# Patient Record
Sex: Female | Born: 2000 | Hispanic: Yes | Marital: Single | Smoking: Never smoker
Health system: Southern US, Community
[De-identification: ages and names within clinical notes are randomized; demographics above are authoritative.]

---
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
## 2021-06-15 ENCOUNTER — Other Ambulatory Visit: Payer: Self-pay

## 2021-06-15 ENCOUNTER — Emergency Department (HOSPITAL_COMMUNITY): Payer: Medicaid Other

## 2021-06-15 ENCOUNTER — Ambulatory Visit (HOSPITAL_COMMUNITY)
Admission: EM | Admit: 2021-06-15 | Discharge: 2021-06-15 | Disposition: A | Discharge status: Home / Self Care | Payer: Medicaid Other | Attending: Emergency Medicine | Admitting: Emergency Medicine

## 2021-06-15 ENCOUNTER — Encounter (HOSPITAL_COMMUNITY)
Admission: EM | Disposition: A | Discharge status: Home / Self Care | Payer: Self-pay | Source: Home / Self Care | Attending: Emergency Medicine

## 2021-06-15 ENCOUNTER — Encounter (HOSPITAL_COMMUNITY): Payer: Self-pay

## 2021-06-15 ENCOUNTER — Ambulatory Visit: Admit: 2021-06-15 | Payer: Medicaid Other | Admitting: General Surgery

## 2021-06-15 DIAGNOSIS — R1013 Epigastric pain: Secondary | ICD-10-CM | POA: Diagnosis present

## 2021-06-15 DIAGNOSIS — Z419 Encounter for procedure for purposes other than remedying health state, unspecified: Secondary | ICD-10-CM

## 2021-06-15 DIAGNOSIS — Z20822 Contact with and (suspected) exposure to covid-19: Secondary | ICD-10-CM | POA: Diagnosis not present

## 2021-06-15 DIAGNOSIS — K8020 Calculus of gallbladder without cholecystitis without obstruction: Secondary | ICD-10-CM

## 2021-06-15 DIAGNOSIS — K8010 Calculus of gallbladder with chronic cholecystitis without obstruction: Secondary | ICD-10-CM | POA: Diagnosis not present

## 2021-06-15 DIAGNOSIS — K810 Acute cholecystitis: Secondary | ICD-10-CM

## 2021-06-15 HISTORY — PX: CHOLECYSTECTOMY: SHX55

## 2021-06-15 LAB — COMPREHENSIVE METABOLIC PANEL
ALT: 133 U/L — ABNORMAL HIGH (ref 0–44)
AST: 125 U/L — ABNORMAL HIGH (ref 15–41)
Albumin: 4.4 g/dL (ref 3.5–5.0)
Alkaline Phosphatase: 98 U/L (ref 38–126)
Anion gap: 8 (ref 5–15)
BUN: 13 mg/dL (ref 6–20)
CO2: 22 mmol/L (ref 22–32)
Calcium: 9.3 mg/dL (ref 8.9–10.3)
Chloride: 106 mmol/L (ref 98–111)
Creatinine, Ser: 0.56 mg/dL (ref 0.44–1.00)
GFR, Estimated: >60 mL/min (ref >60)
Glucose, Bld: 115 mg/dL — ABNORMAL HIGH (ref 70–99)
Potassium: 4.1 mmol/L (ref 3.5–5.1)
Sodium: 136 mmol/L (ref 135–145)
Total Bilirubin: 1.3 mg/dL — ABNORMAL HIGH (ref 0.3–1.2)
Total Protein: 7.6 g/dL (ref 6.5–8.1)

## 2021-06-15 LAB — CBC WITH DIFFERENTIAL/PLATELET
Abs Immature Granulocytes: 0.04 10*3/uL (ref 0.00–0.07)
Basophils Absolute: 0 10*3/uL (ref 0.0–0.1)
Basophils Relative: 0 %
Eosinophils Absolute: 0.1 10*3/uL (ref 0.0–0.5)
Eosinophils Relative: 1 %
HCT: 41.7 % (ref 36.0–46.0)
Hemoglobin: 14.2 g/dL (ref 12.0–15.0)
Immature Granulocytes: 0 %
Lymphocytes Relative: 26 %
Lymphs Abs: 2.7 10*3/uL (ref 0.7–4.0)
MCH: 28.6 pg (ref 26.0–34.0)
MCHC: 34.1 g/dL (ref 30.0–36.0)
MCV: 84.1 fL (ref 80.0–100.0)
Monocytes Absolute: 0.5 10*3/uL (ref 0.1–1.0)
Monocytes Relative: 4 %
Neutro Abs: 7 10*3/uL (ref 1.7–7.7)
Neutrophils Relative %: 69 %
Platelets: 325 10*3/uL (ref 150–400)
RBC: 4.96 MIL/uL (ref 3.87–5.11)
RDW: 12.8 % (ref 11.5–15.5)
WBC: 10.4 10*3/uL (ref 4.0–10.5)
nRBC: 0 % (ref 0.0–0.2)

## 2021-06-15 LAB — URINALYSIS, ROUTINE W REFLEX MICROSCOPIC
Bilirubin Urine: NEGATIVE
Glucose, UA: NEGATIVE mg/dL
Hgb urine dipstick: NEGATIVE
Ketones, ur: NEGATIVE mg/dL
Leukocytes,Ua: NEGATIVE
Nitrite: NEGATIVE
Protein, ur: NEGATIVE mg/dL
Specific Gravity, Urine: 1.002 — ABNORMAL LOW (ref 1.005–1.030)
pH: 6 (ref 5.0–8.0)

## 2021-06-15 LAB — HCG, QUANTITATIVE, PREGNANCY: hCG, Beta Chain, Quant, S: <1 m[IU]/mL (ref <5)

## 2021-06-15 LAB — RESP PANEL BY RT-PCR (FLU A&B, COVID) ARPGX2
Influenza A by PCR: NEGATIVE
Influenza B by PCR: NEGATIVE
SARS Coronavirus 2 by RT PCR: NEGATIVE

## 2021-06-15 LAB — LIPASE, BLOOD: Lipase: 30 U/L (ref 11–51)

## 2021-06-15 SURGERY — LAPAROSCOPIC CHOLECYSTECTOMY
Anesthesia: General | Site: Abdomen

## 2021-06-15 MED ORDER — PROMETHAZINE HCL 25 MG/ML IJ SOLN
INTRAMUSCULAR | Status: AC
  Filled 2021-06-15: qty 1

## 2021-06-15 MED ORDER — PROPOFOL 10 MG/ML IV BOLUS
INTRAVENOUS | Status: DC | PRN
  Administered 2021-06-15: 140 mg via INTRAVENOUS

## 2021-06-15 MED ORDER — ACETAMINOPHEN 500 MG PO TABS
1000.0000 mg | ORAL_TABLET | ORAL | Status: AC
  Administered 2021-06-15: 1000 mg via ORAL
  Filled 2021-06-15: qty 2

## 2021-06-15 MED ORDER — SUGAMMADEX SODIUM 200 MG/2ML IV SOLN
INTRAVENOUS | Status: DC | PRN
  Administered 2021-06-15: 200 mg via INTRAVENOUS

## 2021-06-15 MED ORDER — PROPOFOL 10 MG/ML IV BOLUS
INTRAVENOUS | Status: AC
  Filled 2021-06-15: qty 20

## 2021-06-15 MED ORDER — MIDAZOLAM HCL 2 MG/2ML IJ SOLN
INTRAMUSCULAR | Status: DC | PRN
  Administered 2021-06-15: 2 mg via INTRAVENOUS

## 2021-06-15 MED ORDER — PROMETHAZINE HCL 25 MG/ML IJ SOLN
6.2500 mg | INTRAMUSCULAR | Status: DC | PRN
  Administered 2021-06-15: 6.25 mg via INTRAVENOUS

## 2021-06-15 MED ORDER — LIDOCAINE HCL (PF) 2 % IJ SOLN
INTRAMUSCULAR | Status: AC
  Filled 2021-06-15: qty 5

## 2021-06-15 MED ORDER — SUCCINYLCHOLINE CHLORIDE 200 MG/10ML IV SOSY
PREFILLED_SYRINGE | INTRAVENOUS | Status: DC | PRN
  Administered 2021-06-15: 100 mg via INTRAVENOUS

## 2021-06-15 MED ORDER — ACETAMINOPHEN 500 MG PO TABS: 1000.0000 mg | ORAL_TABLET | Freq: Three times a day (TID) | ORAL | Status: AC | PRN

## 2021-06-15 MED ORDER — ONDANSETRON HCL 4 MG/2ML IJ SOLN
INTRAMUSCULAR | Status: DC | PRN
  Administered 2021-06-15: 4 mg via INTRAVENOUS

## 2021-06-15 MED ORDER — FENTANYL CITRATE PF 50 MCG/ML IJ SOSY
PREFILLED_SYRINGE | INTRAMUSCULAR | Status: AC
  Filled 2021-06-15: qty 2

## 2021-06-15 MED ORDER — MORPHINE SULFATE (PF) 4 MG/ML IV SOLN
4.0000 mg | Freq: Once | INTRAVENOUS | Status: AC
  Administered 2021-06-15: 4 mg via INTRAVENOUS
  Filled 2021-06-15: qty 1

## 2021-06-15 MED ORDER — BUPIVACAINE-EPINEPHRINE 0.25% -1:200000 IJ SOLN
INTRAMUSCULAR | Status: DC | PRN
  Administered 2021-06-15: 14 mL

## 2021-06-15 MED ORDER — MIDAZOLAM HCL 2 MG/2ML IJ SOLN
INTRAMUSCULAR | Status: AC
  Filled 2021-06-15: qty 2

## 2021-06-15 MED ORDER — GABAPENTIN 300 MG PO CAPS
300.0000 mg | ORAL_CAPSULE | ORAL | Status: AC
  Administered 2021-06-15: 300 mg via ORAL
  Filled 2021-06-15: qty 1

## 2021-06-15 MED ORDER — SPY AGENT GREEN - (INDOCYANINE FOR INJECTION)
INTRAMUSCULAR | Status: DC | PRN
  Administered 2021-06-15: 2 mL via INTRAVENOUS

## 2021-06-15 MED ORDER — INDOCYANINE GREEN 25 MG IV SOLR: 1.2500 mg | Freq: Once | INTRAVENOUS | Status: DC

## 2021-06-15 MED ORDER — LIDOCAINE 2% (20 MG/ML) 5 ML SYRINGE
INTRAMUSCULAR | Status: DC | PRN
  Administered 2021-06-15: 50 mg via INTRAVENOUS

## 2021-06-15 MED ORDER — LACTATED RINGERS IV SOLN
INTRAVENOUS | Status: DC | PRN
  Administered 2021-06-15: 1000 mL

## 2021-06-15 MED ORDER — FENTANYL CITRATE (PF) 100 MCG/2ML IJ SOLN
INTRAMUSCULAR | Status: AC
  Filled 2021-06-15: qty 2

## 2021-06-15 MED ORDER — OXYCODONE HCL 5 MG PO TABS: 5.0000 mg | ORAL_TABLET | Freq: Four times a day (QID) | ORAL | 0 refills | Status: DC | PRN

## 2021-06-15 MED ORDER — FENTANYL CITRATE (PF) 100 MCG/2ML IJ SOLN
INTRAMUSCULAR | Status: DC | PRN
  Administered 2021-06-15: 100 ug via INTRAVENOUS
  Administered 2021-06-15: 50 ug via INTRAVENOUS

## 2021-06-15 MED ORDER — FENTANYL CITRATE PF 50 MCG/ML IJ SOSY
25.0000 ug | PREFILLED_SYRINGE | INTRAMUSCULAR | Status: DC | PRN
  Administered 2021-06-15 (×2): 50 ug via INTRAVENOUS
  Administered 2021-06-15 (×2): 25 ug via INTRAVENOUS

## 2021-06-15 MED ORDER — DEXAMETHASONE SODIUM PHOSPHATE 10 MG/ML IJ SOLN
INTRAMUSCULAR | Status: DC | PRN
  Administered 2021-06-15: 4 mg via INTRAVENOUS

## 2021-06-15 MED ORDER — DEXAMETHASONE SODIUM PHOSPHATE 10 MG/ML IJ SOLN
INTRAMUSCULAR | Status: AC
  Filled 2021-06-15: qty 1

## 2021-06-15 MED ORDER — ONDANSETRON HCL 4 MG/2ML IJ SOLN
4.0000 mg | Freq: Once | INTRAMUSCULAR | Status: AC
  Administered 2021-06-15: 4 mg via INTRAVENOUS
  Filled 2021-06-15: qty 2

## 2021-06-15 MED ORDER — ONDANSETRON HCL 4 MG/2ML IJ SOLN
INTRAMUSCULAR | Status: AC
  Filled 2021-06-15: qty 2

## 2021-06-15 MED ORDER — INDOCYANINE GREEN 25 MG IV SOLR
1.2500 mg | INTRAVENOUS | Status: DC
  Filled 2021-06-15: qty 10

## 2021-06-15 MED ORDER — LACTATED RINGERS IV SOLN: INTRAVENOUS | Status: DC

## 2021-06-15 MED ORDER — ROCURONIUM BROMIDE 10 MG/ML (PF) SYRINGE
PREFILLED_SYRINGE | INTRAVENOUS | Status: DC | PRN
  Administered 2021-06-15: 30 mg via INTRAVENOUS

## 2021-06-15 MED ORDER — OXYCODONE HCL 5 MG/5ML PO SOLN: 5.0000 mg | Freq: Once | ORAL | Status: AC | PRN

## 2021-06-15 MED ORDER — OXYCODONE HCL 5 MG PO TABS
ORAL_TABLET | ORAL | Status: AC
  Administered 2021-06-15: 5 mg via ORAL
  Filled 2021-06-15: qty 1

## 2021-06-15 MED ORDER — BUPIVACAINE-EPINEPHRINE (PF) 0.25% -1:200000 IJ SOLN
INTRAMUSCULAR | Status: AC
  Filled 2021-06-15: qty 30

## 2021-06-15 MED ORDER — CEFAZOLIN SODIUM-DEXTROSE 2-4 GM/100ML-% IV SOLN
2.0000 g | INTRAVENOUS | Status: AC
Start: 2021-06-15 — End: 2021-06-15
  Administered 2021-06-15: 2 g via INTRAVENOUS
  Filled 2021-06-15: qty 100

## 2021-06-15 MED ORDER — OXYCODONE HCL 5 MG PO TABS: 5.0000 mg | ORAL_TABLET | Freq: Once | ORAL | Status: AC | PRN

## 2021-06-15 MED ORDER — SODIUM CHLORIDE 0.9 % IV BOLUS
1000.0000 mL | Freq: Once | INTRAVENOUS | Status: AC
  Administered 2021-06-15: 1000 mL via INTRAVENOUS

## 2021-06-15 MED ORDER — FENTANYL CITRATE PF 50 MCG/ML IJ SOSY
PREFILLED_SYRINGE | INTRAMUSCULAR | Status: AC
  Filled 2021-06-15: qty 1

## 2021-06-15 MED ORDER — 0.9 % SODIUM CHLORIDE (POUR BTL) OPTIME
TOPICAL | Status: DC | PRN
  Administered 2021-06-15: 1000 mL

## 2021-06-15 MED ORDER — KETOROLAC TROMETHAMINE 15 MG/ML IJ SOLN: 15.0000 mg | INTRAMUSCULAR | Status: DC

## 2021-06-15 SURGICAL SUPPLY — 43 items
ADH SKN CLS APL DERMABOND .7 (GAUZE/BANDAGES/DRESSINGS) ×2
APL PRP STRL LF DISP 70% ISPRP (MISCELLANEOUS) ×2
APL SKNCLS STERI-STRIP NONHPOA (GAUZE/BANDAGES/DRESSINGS) ×2
APPLIER CLIP 5 13 M/L LIGAMAX5 (MISCELLANEOUS) ×3
APR CLP MED LRG 5 ANG JAW (MISCELLANEOUS) ×2
BAG COUNTER SPONGE SURGICOUNT (BAG) IMPLANT
BAG SPEC RTRVL 10 TROC 200 (ENDOMECHANICALS) ×2
BAG SPNG CNTER NS LX DISP (BAG)
BENZOIN TINCTURE PRP APPL 2/3 (GAUZE/BANDAGES/DRESSINGS) ×2 IMPLANT
CABLE HIGH FREQUENCY MONO STRZ (ELECTRODE) ×3 IMPLANT
CHLORAPREP W/TINT 26 (MISCELLANEOUS) ×3 IMPLANT
CLIP APPLIE 5 13 M/L LIGAMAX5 (MISCELLANEOUS) ×2 IMPLANT
COVER MAYO STAND STRL (DRAPES) IMPLANT
COVER SURGICAL LIGHT HANDLE (MISCELLANEOUS) ×3 IMPLANT
DERMABOND ADVANCED (GAUZE/BANDAGES/DRESSINGS) ×1
DERMABOND ADVANCED .7 DNX12 (GAUZE/BANDAGES/DRESSINGS) ×1 IMPLANT
DEVICE TROCAR PUNCTURE CLOSURE (ENDOMECHANICALS) IMPLANT
DRAPE C-ARM 42X120 X-RAY (DRAPES) IMPLANT
ELECT REM PT RETURN 15FT ADLT (MISCELLANEOUS) ×3 IMPLANT
GLOVE SURG ENC MOIS LTX SZ7 (GLOVE) ×3 IMPLANT
GLOVE SURG UNDER POLY LF SZ7.5 (GLOVE) ×3 IMPLANT
GOWN STRL REUS W/TWL LRG LVL3 (GOWN DISPOSABLE) ×5 IMPLANT
GOWN STRL REUS W/TWL XL LVL3 (GOWN DISPOSABLE) ×6 IMPLANT
GRASPER SUT TROCAR 14GX15 (MISCELLANEOUS) ×2 IMPLANT
HEMOSTAT SNOW SURGICEL 2X4 (HEMOSTASIS) IMPLANT
IRRIG SUCT STRYKERFLOW 2 WTIP (MISCELLANEOUS) ×3
IRRIGATION SUCT STRKRFLW 2 WTP (MISCELLANEOUS) ×2 IMPLANT
KIT BASIN OR (CUSTOM PROCEDURE TRAY) ×3 IMPLANT
POUCH RETRIEVAL ECOSAC 10 (ENDOMECHANICALS) ×2 IMPLANT
POUCH RETRIEVAL ECOSAC 10MM (ENDOMECHANICALS) ×3
SCISSORS LAP 5X35 DISP (ENDOMECHANICALS) ×3 IMPLANT
SET CHOLANGIOGRAPH MIX (MISCELLANEOUS) IMPLANT
SET TUBE SMOKE EVAC HIGH FLOW (TUBING) ×3 IMPLANT
SLEEVE XCEL OPT CAN 5 100 (ENDOMECHANICALS) ×6 IMPLANT
STRIP CLOSURE SKIN 1/2X4 (GAUZE/BANDAGES/DRESSINGS) ×2 IMPLANT
SUT MNCRL AB 4-0 PS2 18 (SUTURE) ×3 IMPLANT
SUT VICRYL 0 UR6 27IN ABS (SUTURE) ×2 IMPLANT
TOWEL OR 17X26 10 PK STRL BLUE (TOWEL DISPOSABLE) ×3 IMPLANT
TOWEL OR NON WOVEN STRL DISP B (DISPOSABLE) ×3 IMPLANT
TRAY LAPAROSCOPIC (CUSTOM PROCEDURE TRAY) ×3 IMPLANT
TROCAR BLADELESS OPT 5 100 (ENDOMECHANICALS) ×3 IMPLANT
TROCAR XCEL BLUNT TIP 100MML (ENDOMECHANICALS) ×3 IMPLANT
TROCAR XCEL NON-BLD 11X100MML (ENDOMECHANICALS) IMPLANT

## 2021-06-15 NOTE — Anesthesia Preprocedure Evaluation (Addendum)
Anesthesia Evaluation  Patient identified by MRN, date of birth, ID band Patient awake    Reviewed: Allergy & Precautions, NPO status , Patient's Chart, lab work & pertinent test results  Airway Mallampati: II  TM Distance: >3 FB Neck ROM: Full    Dental  (+) Teeth Intact   Pulmonary neg pulmonary ROS,    Pulmonary exam normal        Cardiovascular negative cardio ROS   Rhythm:Regular Rate:Normal     Neuro/Psych  Headaches, negative psych ROS   GI/Hepatic Neg liver ROS, Acute cholecystitis    Endo/Other  negative endocrine ROS  Renal/GU negative Renal ROS  negative genitourinary   Musculoskeletal negative musculoskeletal ROS (+)   Abdominal (+)  Abdomen: soft.    Peds  Hematology negative hematology ROS (+)   Anesthesia Other Findings   Reproductive/Obstetrics                            Anesthesia Physical Anesthesia Plan  ASA: 1  Anesthesia Plan: General   Post-op Pain Management:    Induction: Intravenous  PONV Risk Score and Plan: 3 and Ondansetron, Dexamethasone, Midazolam and Treatment may vary due to age or medical condition  Airway Management Planned: Mask and Oral ETT  Additional Equipment: None  Intra-op Plan:   Post-operative Plan: Extubation in OR  Informed Consent: I have reviewed the patients History and Physical, chart, labs and discussed the procedure including the risks, benefits and alternatives for the proposed anesthesia with the patient or authorized representative who has indicated his/her understanding and acceptance.     Dental advisory given  Plan Discussed with: CRNA  Anesthesia Plan Comments: (Lab Results      Component                Value               Date                      WBC                      10.4                06/15/2021                HGB                      14.2                06/15/2021                HCT                       41.7                06/15/2021                MCV                      84.1                06/15/2021                PLT                      325  06/15/2021           Lab Results      Component                Value               Date                      NA                       136                 06/15/2021                K                        4.1                 06/15/2021                CO2                      22                  06/15/2021                GLUCOSE                  115 (H)             06/15/2021                BUN                      13                  06/15/2021                CREATININE               0.56                06/15/2021                CALCIUM                  9.3                 06/15/2021                GFRNONAA                 >60                 06/15/2021          )       Anesthesia Quick Evaluation

## 2021-06-15 NOTE — ED Triage Notes (Signed)
Patient arrives from home with complaint of back and abdominal pain. Pt reports pain ongoing since lastnight and states that upon waking lastnight from pain she threw up. Patient reports problem has been reoccurring since march every 2-3 months.

## 2021-06-15 NOTE — Discharge Instructions (Signed)
[facility]  Please arrive at least 30 min before your appointment to complete your check in paperwork.  If you are unable to arrive 30 min prior to your appointment time we may have to cancel or reschedule you. LAPAROSCOPIC SURGERY: POST OP INSTRUCTIONS Always review your discharge instruction sheet given to you by the facility where your surgery was performed. IF YOU HAVE DISABILITY OR FAMILY LEAVE FORMS, YOU MUST BRING THEM TO THE OFFICE FOR PROCESSING.   DO NOT GIVE THEM TO YOUR DOCTOR.  PAIN CONTROL  First take acetaminophen (Tylenol) AND/or ibuprofen (Advil) to control your pain after surgery.  Follow directions on package.  Taking acetaminophen (Tylenol) and/or ibuprofen (Advil) regularly after surgery will help to control your pain and lower the amount of prescription pain medication you may need.  You should not take more than 4,000 mg (4 grams) of acetaminophen (Tylenol) in 24 hours.  You should not take ibuprofen (Advil), aleve, motrin, naprosyn or other NSAIDS if you have a history of stomach ulcers or chronic kidney disease.  A prescription for pain medication may be given to you upon discharge.  Take your pain medication as prescribed, if you still have uncontrolled pain after taking acetaminophen (Tylenol) or ibuprofen (Advil). Use ice packs to help control pain. If you need a refill on your pain medication, please contact your pharmacy.  They will contact our office to request authorization. Prescriptions will not be filled after 5pm or on week-ends.  HOME MEDICATIONS Take your usually prescribed medications unless otherwise directed.  DIET You should follow a light diet the first few days after arrival home.  Be sure to include lots of fluids daily. Avoid fatty, fried foods.   CONSTIPATION It is common to experience some constipation after surgery and if you are taking pain medication.  Increasing fluid intake and taking a stool softener (such as Colace)  will usually help or prevent this problem from occurring.  A mild laxative (Milk of Magnesia or Miralax) should be taken according to package instructions if there are no bowel movements after 48 hours.  WOUND/INCISION CARE Most patients will experience some swelling and bruising in the area of the incisions.  Ice packs will help.  Swelling and bruising can take several days to resolve.  Unless discharge instructions indicate otherwise, follow guidelines below  STERI-STRIPS - you may remove your outer bandages 48 hours after surgery, and you may shower at that time.  You have steri-strips (small skin tapes) in place directly over the incision.  These strips should be left on the skin for 7-10 days.   DERMABOND/SKIN GLUE - you may shower in 24 hours.  The glue will flake off over the next 2-3 weeks. Any sutures or staples will be removed at the office during your follow-up visit.  ACTIVITIES You may resume regular (light) daily activities beginning the next day--such as daily self-care, walking, climbing stairs--gradually increasing activities as tolerated.  You may have sexual intercourse when it is comfortable.  Refrain from any heavy lifting or straining until approved by your doctor. You may drive when you are no longer taking prescription pain medication, you can comfortably wear a seatbelt, and you can safely maneuver your car and apply brakes.  FOLLOW-UP You should see your doctor in the office for a follow-up appointment approximately 2-3 weeks after your surgery.  You should have been given your post-op/follow-up appointment when your surgery was scheduled.  If you did not receive a post-op/follow-up appointment, make sure   that you call for this appointment within a day or two after you arrive home to insure a convenient appointment time.  OTHER INSTRUCTIONS  WHEN TO CALL YOUR DOCTOR: Fever over 101.0 Inability to urinate Continued bleeding from incision. Increased pain, redness, or  drainage from the incision. Increasing abdominal pain  The clinic staff is available to answer your questions during regular business hours.  Please don't hesitate to call and ask to speak to one of the nurses for clinical concerns.  If you have a medical emergency, go to the nearest emergency room or call 911.  A surgeon from [facility] is always on call at the hospital. [address], [address], [place], [place]  [address] ? [address] [address], [place], [place]   [address] [phone] ? [phone] ? [phone]   

## 2021-06-15 NOTE — Transfer of Care (Signed)
Immediate Anesthesia Transfer of Care Note  Patient: Tricia Pratt  Procedure(s) Performed: LAPAROSCOPIC CHOLECYSTECTOMY WITH ICG DYE (Abdomen)  Patient Location: PACU  Anesthesia Type:General  Level of Consciousness: awake, alert  and oriented  Airway & Oxygen Therapy: Patient Spontanous Breathing and Patient connected to face mask oxygen  Post-op Assessment: Report given to RN, Post -op Vital signs reviewed and stable and Patient moving all extremities X 4  Post vital signs: Reviewed and stable  Last Vitals:  Vitals Value Taken Time  BP 128/58 06/15/21 1409  Temp    Pulse 92 06/15/21 1410  Resp 20 06/15/21 1410  SpO2 100 % 06/15/21 1410  Vitals shown include unvalidated device data.  Last Pain:  Vitals:   06/15/21 1223  TempSrc:   PainSc: 2          Complications: No notable events documented.

## 2021-06-15 NOTE — Anesthesia Procedure Notes (Signed)
Procedure Name: Intubation Date/Time: 06/15/2021 12:53 PM Performed by: Niel Hummer, CRNA Pre-anesthesia Checklist: Patient identified, Emergency Drugs available, Suction available and Patient being monitored Patient Re-evaluated:Patient Re-evaluated prior to induction Oxygen Delivery Method: Circle system utilized Preoxygenation: Pre-oxygenation with 100% oxygen Induction Type: IV induction and Rapid sequence Laryngoscope Size: Mac and 4 Grade View: Grade I Tube type: Oral Tube size: 7.0 mm Number of attempts: 1 Airway Equipment and Method: Stylet Placement Confirmation: ETT inserted through vocal cords under direct vision, positive ETCO2 and breath sounds checked- equal and bilateral Secured at: 21 cm Tube secured with: Tape Dental Injury: Teeth and Oropharynx as per pre-operative assessment

## 2021-06-15 NOTE — Op Note (Signed)
Preoperative diagnosis: cholecystitis Postoperative diagnosis: acute cholecystitis Procedure: Laparoscopic cholecystectomy Surgeon: Dr. Harden Mo Asst: Leary Roca, PA-C Anesthesia: General Estimated blood loss: Minimal Complications: None Drains: None Specimens: Gallbladder and contents to pathology Sponge needle count was correct at completion Disposition to recovery stable condition  Indications: 20 yof with ruq pain, gallstones on [place] who I discussed laparoscopic cholecystectomy with. we discussed possible cholangiogram due to mildly elevated lfts.    Procedure: After informed consent was obtained the patient was taken to the OR.  She was given antibiotics.  SCDs were in place.  She was then placed under general anesthesia without complication.  She was prepped and draped in the standard sterile surgical fashion.  A surgical timeout was then performed.  I infiltrated Marcaine below the umbilicus.  I made a vertical incision.  I grasped the fascia and incised it sharply.  I entered the peritoneum bluntly.  There was no evidence of an entry injury.  I then placed a 0 Vicryl pursestring suture through the fascia.  I then placed a Hassan trocar and insufflated the abdomen to 15 mmHg pressure.  I then placed 3 additional 5 mm trocars in epigastrium and right side of the abdomen under direct vision without complication.  Her gallbladder was noted to have acute cholecystitis.   I retracted the gallbladder cephalad and lateral.  this was difficult as there was a large stone impacted at the neck. I did have her injected with ICG dye and used this to help identify the critical view of safety.  I was able to obtain the critical view of safety.  I then clipped the cystic duct.   I divided it leaving 3 clips in place.  The cystic duct was viable and the clips completely traversed the duct.  I treated the artery in a similar fashion. The gallbladder is then removed from the liver bed.  I then placed  the gallbladder in retrieval bag. I removed all of this from the umbilical trocar site.  Hemostasis was obtained.  I then removed the [facility] trocar.  I tied my pursestring down.  I placed an additional 0 Vicryl suture using the suture passer device to completely obliterate this defect.  I then desufflated the abdomen and remove the remaining trocars.  These were closed with 4-0 Monocryl and glue.  She tolerated this well was extubated transferred to recovery stable.

## 2021-06-15 NOTE — H&P (Signed)
[facility] Admission Note  Tricia Pratt August 03, 2001  [id number].    Requesting MD: Dr. Eulis Foster, [facility] PA-C Chief Complaint/Reason for Consult: Acute Cholecystitis   HPI:  Tricia Pratt is a 20 y.o. female who presented to the [facility] ED on 10/19 for abdominal pain, n/v. Patient reports that yesterday she began having epigastric and ruq abdominal pain with radiation to her back that was constant, moderate in severity (6-7/10) and associated with nausea. She reports 1 episode of emesis. She tried Alka-seltzer without help. She ate a peach last night and had some abdominal discomfort. She reports history of similar symptoms in the past since February of this year that self resolved. She presented to the ED as this episode was more severe and persisted into this morning.   She underwent workup in the ED and was found to have normal wbc at 10.4, normal lipase, elevated LFT's (AST 125, ALT 133, Alk Phos 98, T. Bili 1.3), and RUQ [place] w/ Cholelithiasis w/ 1.2cm gallstone in GB neck. No GB wall thickening or pericholecystitis fluid. CBD 3.6. General surgery asked to see.  NPO: Yes PMH significant for None Abdominal surgical history: None Anticoagulants: None Tbcc: Never Alc: None Illicit drug use: None Employment: Ship broker at [employer]  ROS: Review of Systems  Constitutional:  Negative for chills and fever.  Respiratory:  Negative for cough.   Cardiovascular:  Negative for chest pain.  Gastrointestinal:  Positive for abdominal pain, nausea and vomiting. Negative for diarrhea.  Genitourinary: Negative.  Negative for dysuria.  Musculoskeletal:  Positive for back pain.  Psychiatric/Behavioral:  Negative for substance abuse.   All other systems reviewed and are negative. All systems reviewed and otherwise negative except for as above  History reviewed. No pertinent family history.  History reviewed. No pertinent past medical history.  History reviewed. No  pertinent surgical history.  Social History:  reports that she has never smoked. She has never used smokeless tobacco. She reports that she does not drink alcohol and does not use drugs.  Allergies: No Known Allergies  (Not in a hospital admission)   Prior to Admission medications   Not on File    Blood pressure 102/64, pulse (!) 58, temperature 98.5 F (36.9 C), temperature source Oral, resp. rate 16, height $RemoveBe'5\' 1"'uTdiAkeUB$  (1.549 m), weight 81.3 kg, SpO2 99 %. Physical Exam: General: pleasant, WD/WN female who is laying in bed in NAD HEENT: head is normocephalic, atraumatic.  Sclera are noninjected.  Pupils equal and round.  Ears and nose without any masses or lesions.  Mouth is pink and moist. Dentition fair Heart: Bradycardic with regular rhythm.  Normal s1,s2. No obvious murmurs, gallops, or rubs noted.  Palpable pedal pulses bilaterally  Lungs: CTAB, no wheezes, rhonchi, or rales noted.  Respiratory effort nonlabored Abd: Soft, ND, minimal epigastric and RUQ tenderness without peritonitis, +BS, no masses, hernias, or organomegaly. No prior surgical scars.  MS: no BUE/BLE edema, calves soft and nontender Skin: warm and dry with no masses, lesions, or rashes Psych: A&Ox4 with an appropriate affect Neuro: cranial nerves grossly intact, equal strength in BUE/BLE bilaterally, normal speech, thought process intact  Results for orders placed or performed during the hospital encounter of 06/15/21 (from the past 48 hour(s))  CBC with Differential     Status: None   Collection Time: 06/15/21  5:52 AM  Result Value Ref Range   WBC 10.4 4.0 - 10.5 K/uL   RBC 4.96 3.87 - 5.11 MIL/uL   Hemoglobin 14.2 12.0 -  15.0 g/dL   HCT 41.7 36.0 - 46.0 %   MCV 84.1 80.0 - 100.0 fL   MCH 28.6 26.0 - 34.0 pg   MCHC 34.1 30.0 - 36.0 g/dL   RDW 12.8 11.5 - 15.5 %   Platelets 325 150 - 400 K/uL   nRBC 0.0 0.0 - 0.2 %   Neutrophils Relative % 69 %   Neutro Abs 7.0 1.7 - 7.7 K/uL   Lymphocytes Relative 26 %    Lymphs Abs 2.7 0.7 - 4.0 K/uL   Monocytes Relative 4 %   Monocytes Absolute 0.5 0.1 - 1.0 K/uL   Eosinophils Relative 1 %   Eosinophils Absolute 0.1 0.0 - 0.5 K/uL   Basophils Relative 0 %   Basophils Absolute 0.0 0.0 - 0.1 K/uL   Immature Granulocytes 0 %   Abs Immature Granulocytes 0.04 0.00 - 0.07 K/uL    Comment: Performed at [facility], [place] [address], [place], [place] [address]  Comprehensive metabolic panel     Status: Abnormal   Collection Time: 06/15/21  5:52 AM  Result Value Ref Range   Sodium 136 135 - 145 mmol/L   Potassium 4.1 3.5 - 5.1 mmol/L   Chloride 106 98 - 111 mmol/L   CO2 22 22 - 32 mmol/L   Glucose, Bld 115 (H) 70 - 99 mg/dL    Comment: Glucose reference range applies only to samples taken after fasting for at least 8 hours.   BUN 13 6 - 20 mg/dL   Creatinine, Ser 0.56 0.44 - 1.00 mg/dL   Calcium 9.3 8.9 - 10.3 mg/dL   Total Protein 7.6 6.5 - 8.1 g/dL   Albumin 4.4 3.5 - 5.0 g/dL   AST 125 (H) 15 - 41 U/L   ALT 133 (H) 0 - 44 U/L   Alkaline Phosphatase 98 38 - 126 U/L   Total Bilirubin 1.3 (H) 0.3 - 1.2 mg/dL   GFR, Estimated >60 >60 mL/min    Comment: (NOTE) Calculated using the CKD-EPI Creatinine Equation (2021)    Anion gap 8 5 - 15    Comment: Performed at [facility], [place] [address], [place], [place] [address]  Lipase, blood     Status: None   Collection Time: 06/15/21  5:52 AM  Result Value Ref Range   Lipase 30 11 - 51 U/L    Comment: Performed at [facility], [place] [address], [place],  [address]  hCG, quantitative, pregnancy     Status: None   Collection Time: 06/15/21  5:52 AM  Result Value Ref Range   hCG, Beta Chain, Quant, S <1 <5 mIU/mL    Comment:          GEST. AGE      CONC.  (mIU/mL)   <=1 WEEK        5 - 50     2 WEEKS       50 - 500     3 WEEKS       100 - 10,000     4 WEEKS     1,000 - 30,000     5 WEEKS     3,500 - 115,000   6-8 WEEKS     12,000 - 270,000     12 WEEKS     15,000 - 220,000        FEMALE AND NON-PREGNANT FEMALE:     LESS THAN 5 mIU/mL Performed at [facility], [place]  [address], [place], [place] [address]   Urinalysis, Routine w reflex microscopic Urine, Clean Catch     Status: Abnormal   Collection Time: 06/15/21  8:18 AM  Result Value Ref Range   Color, Urine COLORLESS (A) YELLOW   APPearance CLEAR CLEAR   Specific Gravity, Urine 1.002 (L) 1.005 - 1.030   pH 6.0 5.0 - 8.0   Glucose, UA NEGATIVE NEGATIVE mg/dL   Hgb urine dipstick NEGATIVE NEGATIVE   Bilirubin Urine NEGATIVE NEGATIVE   Ketones, ur NEGATIVE NEGATIVE mg/dL   Protein, ur NEGATIVE NEGATIVE mg/dL   Nitrite NEGATIVE NEGATIVE   Leukocytes,Ua NEGATIVE NEGATIVE    Comment: Performed at [place] [address], [place], [place] [address]   US Abdomen Limited RUQ (LIVER/GB)  Result Date: 06/15/2021 CLINICAL DATA:  Epigastric pain, elevated LFTs EXAM: ULTRASOUND ABDOMEN LIMITED RIGHT UPPER QUADRANT COMPARISON:  None. FINDINGS: Gallbladder: 1.2 cm echogenic gallstone in the gallbladder neck. The gallbladder is nondistended. There is no wall thickening or pericholecystic fluid. Sonographer describes no sonographic Murphy's sign. Common bile duct: Diameter: 3.6 mm Liver: Echogenic parenchyma, difficult to penetrate. No discrete lesion or biliary ductal dilatation evident. Portal vein is patent on color Doppler imaging with normal direction of blood flow towards the liver. Other: None. IMPRESSION: 1. Gallstone without other ultrasound evidence of cholecystitis or biliary obstruction. Electronically Signed   By: Lucrezia Europe M.D.   On: 06/15/2021 07:44    Anti-infectives (From admission, onward)    Start     Dose/Rate Route Frequency Ordered Stop   06/15/21 1115  ceFAZolin (ANCEF) IVPB 2g/100 mL premix        2 g 200 mL/hr over 30 Minutes Intravenous On call to O.R. 06/15/21 1105 06/16/21 0559        Assessment/Plan Symptomatic  Cholelithiasis with possible early Cholecystitis  - Patient with continued abdominal tenderness, elevated LFT's, and u/s findings of a 1.2cm gallstone in GB neck.  Recommend Laparoscopic Cholecystectomy.  I have explained the procedure, risks, and aftercare of Laparoscopic cholecystectomy with possible IOC.  Risks include but are not limited to anesthesia (MI, CVA, death), bleeding, infection, wound problems, hernia, bile leak, injury to common bile duct/liver/intestine, increased risk of DVT/PE and diarrhea post op.  She seems to understand and agrees to proceed. Abx peri-op. Possible d/c from PACU.   ID - Ancef peri-op VTE - SCDs FEN - NPO Foley - None Follow up - TBD   Margie Billet, [facility] Surgery 06/15/2021, 11:05 AM Please see Amion for pager number during day hours 7:00am-4:30pm

## 2021-06-15 NOTE — ED Notes (Signed)
US at bedside

## 2021-06-15 NOTE — ED Provider Notes (Signed)
[facility]-EMERGENCY DEPT Provider Note   CSN: [id number] Arrival date & time: 06/15/21  5366    History Abd pain  Tricia Pratt is a 20 y.o. female with no insignificant past medical history who presents for evaluation on abd pain and emesis. Pain located to epigastric region. Began yesterday. Pain constant, not worse with food intake. Attempted to drink tea which helped. Similar episode in the past which self resolved. 1 episode of NBNB emesis. Took Aleve at 5 AM which did not help. Pain goes into back. No CP, SOB, cough, hx of PE, DVT. No leg swelling, pain. Last LMP in May, irregular. Denies chance of pregnancy, never sexually active. No fever, low abd pain, urinary complaints. No focal RLQ, LLQ pain.  Rates pain a 6-7/10. No chronic Etoh use, NSAID use. No melena or BRPBR.   No additional aggravating  or alleviating factors.   History obtained from patient and past medical records. No interpretor was used.  HPI     History reviewed. No pertinent past medical history.  Patient Active Problem List   Diagnosis Date Noted   Fatigue 10/12/2015   Shortness of breath 10/04/2015   Gastritis 09/30/2014   Tension headache 09/30/2014   Well child check 03/22/2012    History reviewed. No pertinent surgical history.   OB History   No obstetric history on file.     History reviewed. No pertinent family history.  Social History   Tobacco Use   Smoking status: Never   Smokeless tobacco: Never  Substance Use Topics   Alcohol use: No   Drug use: No    Home Medications Prior to Admission medications   Medication Sig Start Date End Date Taking? Authorizing Provider  ibuprofen (ADVIL) 200 MG tablet Take 200-400 mg by mouth every 6 (six) hours as needed for mild pain.   Yes [provider]    Allergies    Patient has no known allergies.  Review of Systems   Review of Systems  Constitutional: Negative.   HENT: Negative.     Respiratory: Negative.    Cardiovascular: Negative.   Gastrointestinal:  Positive for nausea and vomiting. Negative for abdominal distention, abdominal pain, anal bleeding, blood in stool, constipation, diarrhea and rectal pain.  Genitourinary: Negative.   Musculoskeletal:  Positive for back pain.  Skin: Negative.   Neurological: Negative.   All other systems reviewed and are negative.  Physical Exam Updated Vital Signs BP 111/63   Pulse 71   Temp 98.5 F (36.9 C) (Oral)   Resp 15   Ht 5\' 1"  (1.549 m)   Wt 81.3 kg   SpO2 100%   BMI 33.86 kg/m   Physical Exam Vitals and nursing note reviewed.  Constitutional:      General: She is not in acute distress.    Appearance: She is well-developed. She is not ill-appearing, toxic-appearing or diaphoretic.  HENT:     Head: Normocephalic and atraumatic.     Nose: Nose normal.     Mouth/Throat:     Mouth: Mucous membranes are moist.  Eyes:     Pupils: Pupils are equal, round, and reactive to light.  Cardiovascular:     Rate and Rhythm: Normal rate.     Pulses: Normal pulses.  Pulmonary:     Effort: Pulmonary effort is normal. No respiratory distress.     Breath sounds: Normal breath sounds.     Comments: , speaks in full sentences without difficulty  Abdominal:  General: Bowel sounds are normal. There is no distension.     Palpations: Abdomen is soft.     Tenderness: There is generalized abdominal tenderness and tenderness in the epigastric area. There is no right CVA tenderness, left CVA tenderness, guarding or rebound. Negative signs include Murphy's sign.     Hernia: No hernia is present.       Comments: Diffuse tenderness to epigastric region, midline abdomen. Neg Murphy sign  Musculoskeletal:        General: Normal range of motion.     Cervical back: Normal range of motion.     Comments: No bony tenderness, Moves all 4 extremities without difficulty. Compartments soft  Skin:    General: Skin is warm and dry.      Comments: No rashes or lesions  Neurological:     General: No focal deficit present.     Mental Status: She is alert.  Psychiatric:        Mood and Affect: Mood normal.    ED Results / Procedures / Treatments   Labs (all labs ordered are listed, but only abnormal results are displayed) Labs Reviewed  COMPREHENSIVE METABOLIC PANEL - Abnormal; Notable for the following components:      Result Value   Glucose, Bld 115 (*)    AST 125 (*)    ALT 133 (*)    Total Bilirubin 1.3 (*)    All other components within normal limits  URINALYSIS, ROUTINE W REFLEX MICROSCOPIC - Abnormal; Notable for the following components:   Color, Urine COLORLESS (*)    Specific Gravity, Urine 1.002 (*)    All other components within normal limits  RESP PANEL BY RT-PCR (FLU A&B, COVID) ARPGX2  CBC WITH DIFFERENTIAL/PLATELET  LIPASE, BLOOD  HCG, QUANTITATIVE, PREGNANCY    EKG None  Radiology US Abdomen Limited RUQ (LIVER/GB)  Result Date: 06/15/2021 CLINICAL DATA:  Epigastric pain, elevated LFTs EXAM: ULTRASOUND ABDOMEN LIMITED RIGHT UPPER QUADRANT COMPARISON:  None. FINDINGS: Gallbladder: 1.2 cm echogenic gallstone in the gallbladder neck. The gallbladder is nondistended. There is no wall thickening or pericholecystic fluid. Sonographer describes no sonographic Murphy's sign. Common bile duct: Diameter: 3.6 mm Liver: Echogenic parenchyma, difficult to penetrate. No discrete lesion or biliary ductal dilatation evident. Portal vein is patent on color Doppler imaging with normal direction of blood flow towards the liver. Other: None. IMPRESSION: 1. Gallstone without other ultrasound evidence of cholecystitis or biliary obstruction. Electronically Signed   By: Corlis Leak M.D.   On: 06/15/2021 07:44    Procedures Procedures   Medications Ordered in ED Medications  indocyanine green (IC-GREEN) injection 1.25 mg (has no administration in time range)  gabapentin (NEURONTIN) capsule 300 mg (has no  administration in time range)  acetaminophen (TYLENOL) tablet 1,000 mg (has no administration in time range)  ketorolac (TORADOL) 15 MG/ML injection 15 mg (has no administration in time range)  ceFAZolin (ANCEF) IVPB 2g/100 mL premix (has no administration in time range)  morphine 4 MG/ML injection 4 mg (4 mg Intravenous Given 06/15/21 0716)  sodium chloride 0.9 % bolus 1,000 mL (1,000 mLs Intravenous New Bag/Given 06/15/21 0715)  ondansetron (ZOFRAN) injection 4 mg (4 mg Intravenous Given 06/15/21 0716)    ED Course  I have reviewed the triage vital signs and the nursing notes.  Pertinent labs & imaging results that were available during my care of the patient were reviewed by me and considered in my medical decision making (see chart for details).  Here for 2 days of  epigastric abd pain.  Diffuse tenderness to epigastric region however negative Murphy sign.  She is afebrile, nonseptic, not ill-appearing.  Pain not worse with food intake.  Has not had a menstrual cycle in many months.  Denies chance of pregnancy, negative pregnancy test here.  She will need follow-up outpatient for this.  No chronic NSAID use, EtOH use.  Does have some back pain however no chest pain, shortness of breath, PERC negative, Wells criteria low risk.  No UR Sx. low suspicion for atypical thoracic etiology.  Work-up started from triage today personally reviewed and interpreted:  CBC without leukocytosis Lipase 30 Pregnancy test negative Metabolic panel with elevation AST, ALT, Tbili 1.3  Plan on [place]  Ultrasound shows 1.2 cm stone in gallbladder neck, no edema, surrounding fluid.  Reassessed patient.  Her pain is controlled however she is afraid to p.o. challenge due to pain.  Will discuss with general surgery given elevated LFTs  CONSULT with Surgery who will evaluate patient at bedside for admission  The patient appears reasonably stabilized for admission considering the current resources, flow, and  capabilities available in the ED at this time, and I doubt any other [facility] requiring further screening and/or treatment in the ED prior to admission.     Clinical Course as of 06/15/21 1204  Wed Jun 15, 2021  2902 US Abdomen Limited RUQ (LIVER/GB) [RA]    Clinical Course User Index [RA] Stephania Fragmin   MDM Rules/Calculators/A&P                            Final Clinical Impression(s) / ED Diagnoses Final diagnoses:  Epigastric abdominal pain  Calculus of gallbladder without cholecystitis without obstruction    Rx / DC Orders ED Discharge Orders     None        Arael Piccione A, PA-C 06/15/21 1204    Mancel Bale, MD 06/15/21 1805

## 2021-06-15 NOTE — ED Notes (Signed)
Informed consent obtained and witnessed by this RN. Left at bedside with pt.

## 2021-06-16 ENCOUNTER — Encounter (HOSPITAL_COMMUNITY): Payer: Self-pay | Admitting: General Surgery

## 2021-06-16 LAB — SURGICAL PATHOLOGY

## 2021-06-17 NOTE — Anesthesia Postprocedure Evaluation (Signed)
Anesthesia Post Note  Patient: Tricia Pratt  Procedure(s) Performed: LAPAROSCOPIC CHOLECYSTECTOMY WITH ICG DYE (Abdomen)     Patient location during evaluation: PACU Anesthesia Type: General Level of consciousness: awake and alert Pain management: pain level controlled Vital Signs Assessment: post-procedure vital signs reviewed and stable Respiratory status: spontaneous breathing, nonlabored ventilation, respiratory function stable and patient connected to nasal cannula oxygen Cardiovascular status: blood pressure returned to baseline and stable Postop Assessment: no apparent nausea or vomiting Anesthetic complications: no   No notable events documented.  Last Vitals:  Vitals:   06/15/21 1530 06/15/21 1550  BP: 115/66 118/74  Pulse: 66 78  Resp: 18 16  Temp:    SpO2: 91% 97%    Last Pain:  Vitals:   06/15/21 1550  TempSrc:   PainSc: 3                  [name]

## 2022-07-18 ENCOUNTER — Ambulatory Visit
Admission: EM | Admit: 2022-07-18 | Discharge: 2022-07-18 | Disposition: A | Discharge status: Home / Self Care | Payer: Medicaid Other | Attending: Urgent Care | Admitting: Urgent Care

## 2022-07-18 DIAGNOSIS — R110 Nausea: Secondary | ICD-10-CM

## 2022-07-18 DIAGNOSIS — K529 Noninfective gastroenteritis and colitis, unspecified: Secondary | ICD-10-CM

## 2022-07-18 DIAGNOSIS — R197 Diarrhea, unspecified: Secondary | ICD-10-CM | POA: Diagnosis present

## 2022-07-18 DIAGNOSIS — R140 Abdominal distension (gaseous): Secondary | ICD-10-CM | POA: Diagnosis present

## 2022-07-18 MED ORDER — LOPERAMIDE HCL 2 MG PO CAPS: 2.0000 mg | ORAL_CAPSULE | Freq: Two times a day (BID) | ORAL | 0 refills | Status: AC | PRN

## 2022-07-18 MED ORDER — ONDANSETRON 8 MG PO TBDP: 8.0000 mg | ORAL_TABLET | Freq: Three times a day (TID) | ORAL | 0 refills | Status: AC | PRN

## 2022-07-18 NOTE — Discharge Instructions (Addendum)

## 2022-07-18 NOTE — ED Provider Notes (Signed)
[facility]  Note:  This document was prepared using Systems analyst and may include unintentional dictation errors.  MRN: [id number] DOB: 12-20-2000  Subjective:   Tricia Pratt is a 21 y.o. female presenting for 2-week history of persistent intermittent nausea without vomiting, diarrhea, having 4-5 loose watery stools a day, abdominal pain.  No fever, bloody stools, recent antibiotic use, hospitalizations or long distance travel.  Has not eaten raw foods, drank unfiltered water.  No history of GI disorders including Crohn's, IBS, ulcerative colitis.  No respiratory disorders.  Patient has a history of a cholecystectomy done 06/15/2021.  No current facility-administered medications for this encounter.  Current Outpatient Medications:    ibuprofen (ADVIL) 200 MG tablet, Take 200-400 mg by mouth every 6 (six) hours as needed for mild pain., Disp: , Rfl:    oxyCODONE (ROXICODONE) 5 MG immediate release tablet, Take 1 tablet (5 mg total) by mouth every 6 (six) hours as needed for breakthrough pain., Disp: 15 tablet, Rfl: 0   No Known Allergies  History reviewed. No pertinent past medical history.   Past Surgical History:  Procedure Laterality Date   CHOLECYSTECTOMY N/A 06/15/2021   Procedure: LAPAROSCOPIC CHOLECYSTECTOMY WITH ICG DYE;  Surgeon: Rolm Bookbinder, MD;  Location: [facility];  Service: General;  Laterality: N/A;    History reviewed. No pertinent family history.  Social History   Tobacco Use   Smoking status: Never   Smokeless tobacco: Never  Vaping Use   Vaping Use: Never used  Substance Use Topics   Alcohol use: No   Drug use: No    ROS   Objective:   Vitals: BP 103/66 (BP Location: Right Arm)   Pulse 78   Temp 98.1 F (36.7 C) (Oral)   Resp 18   LMP 07/04/2022 (Approximate)   SpO2 98%   Physical Exam Constitutional:      General: She is not in acute distress.    Appearance: Normal appearance. She  is well-developed. She is not ill-appearing, toxic-appearing or diaphoretic.  HENT:     Head: Normocephalic and atraumatic.     Nose: Nose normal.     Mouth/Throat:     Mouth: Mucous membranes are moist.     Pharynx: Oropharynx is clear.  Eyes:     General: No scleral icterus.       Right eye: No discharge.        Left eye: No discharge.     Extraocular Movements: Extraocular movements intact.     Conjunctiva/sclera: Conjunctivae normal.  Cardiovascular:     Rate and Rhythm: Normal rate and regular rhythm.     Heart sounds: Normal heart sounds. No murmur heard.    No friction rub. No gallop.  Pulmonary:     Effort: Pulmonary effort is normal. No respiratory distress.     Breath sounds: No stridor. No wheezing, rhonchi or rales.  Chest:     Chest wall: No tenderness.  Abdominal:     General: Bowel sounds are normal. There is no distension.     Palpations: Abdomen is soft. There is no mass.     Tenderness: There is generalized abdominal tenderness (mild throughout). There is no right CVA tenderness, left CVA tenderness, guarding or rebound.  Skin:    General: Skin is warm and dry.  Neurological:     General: No focal deficit present.     Mental Status: She is alert and oriented to person, place, and time.  Psychiatric:  Mood and Affect: Mood normal.        Behavior: Behavior normal.        Thought Content: Thought content normal.        Judgment: Judgment normal.     Assessment and Plan :   PDMP not reviewed this encounter.  1. Colitis   2. Nausea without vomiting   3. Diarrhea, unspecified type   4. Abdominal bloating     Patient attempted to provide stool sample in clinic but was unable to do so and therefore was sent out with a stool kit.  Low suspicion for an acute abdomen.  Will manage for suspected colitis with supportive care.  Recommended patient hydrate well, eat light meals and maintain electrolytes.  Will use Zofran and Imodium for nausea, vomiting and  diarrhea. Counseled patient on potential for adverse effects with medications prescribed/recommended today, ER and return-to-clinic precautions discussed, patient verbalized understanding.    Jaynee Eagles, PA-C 07/18/22 1810

## 2022-07-18 NOTE — ED Triage Notes (Signed)
Pt c/o generalized abd pain, bloating, n/d x 2 weeks-no OTC meds-NAD-steady gait

## 2022-07-20 LAB — GASTROINTESTINAL PANEL BY PCR, STOOL (REPLACES STOOL CULTURE)
